# Patient Record
Sex: Male | Born: 1990 | Race: White | Hispanic: No | Marital: Single | Smoking: Current every day smoker
Health system: Southern US, Community
[De-identification: ages and names within clinical notes are randomized; demographics above are authoritative.]

---
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
## 2008-04-18 ENCOUNTER — Emergency Department: Payer: Self-pay | Admitting: Emergency Medicine

## 2008-08-16 IMAGING — CR DG CHEST 2V
1 series · 2 of 2 positions shown · non-contrast
Comparison: none

REASON FOR EXAM: Fever, productive cough
COMMENTS:   LMP: (Male)

PROCEDURE:     DXR - DXR CHEST PA (OR AP) AND LATERAL  - June 14, 2007  [DATE]
RESULT:     The lung fields are clear. No pneumonia, pneumothorax or pleural
effusion is seen. The heart size is normal. The chest is mildly
hyperexpanded suspicious for reactive airway disease.

[Series 1: view not recorded · 0.17mm/px · 2 of 2 slices shown]
[im 1/2]
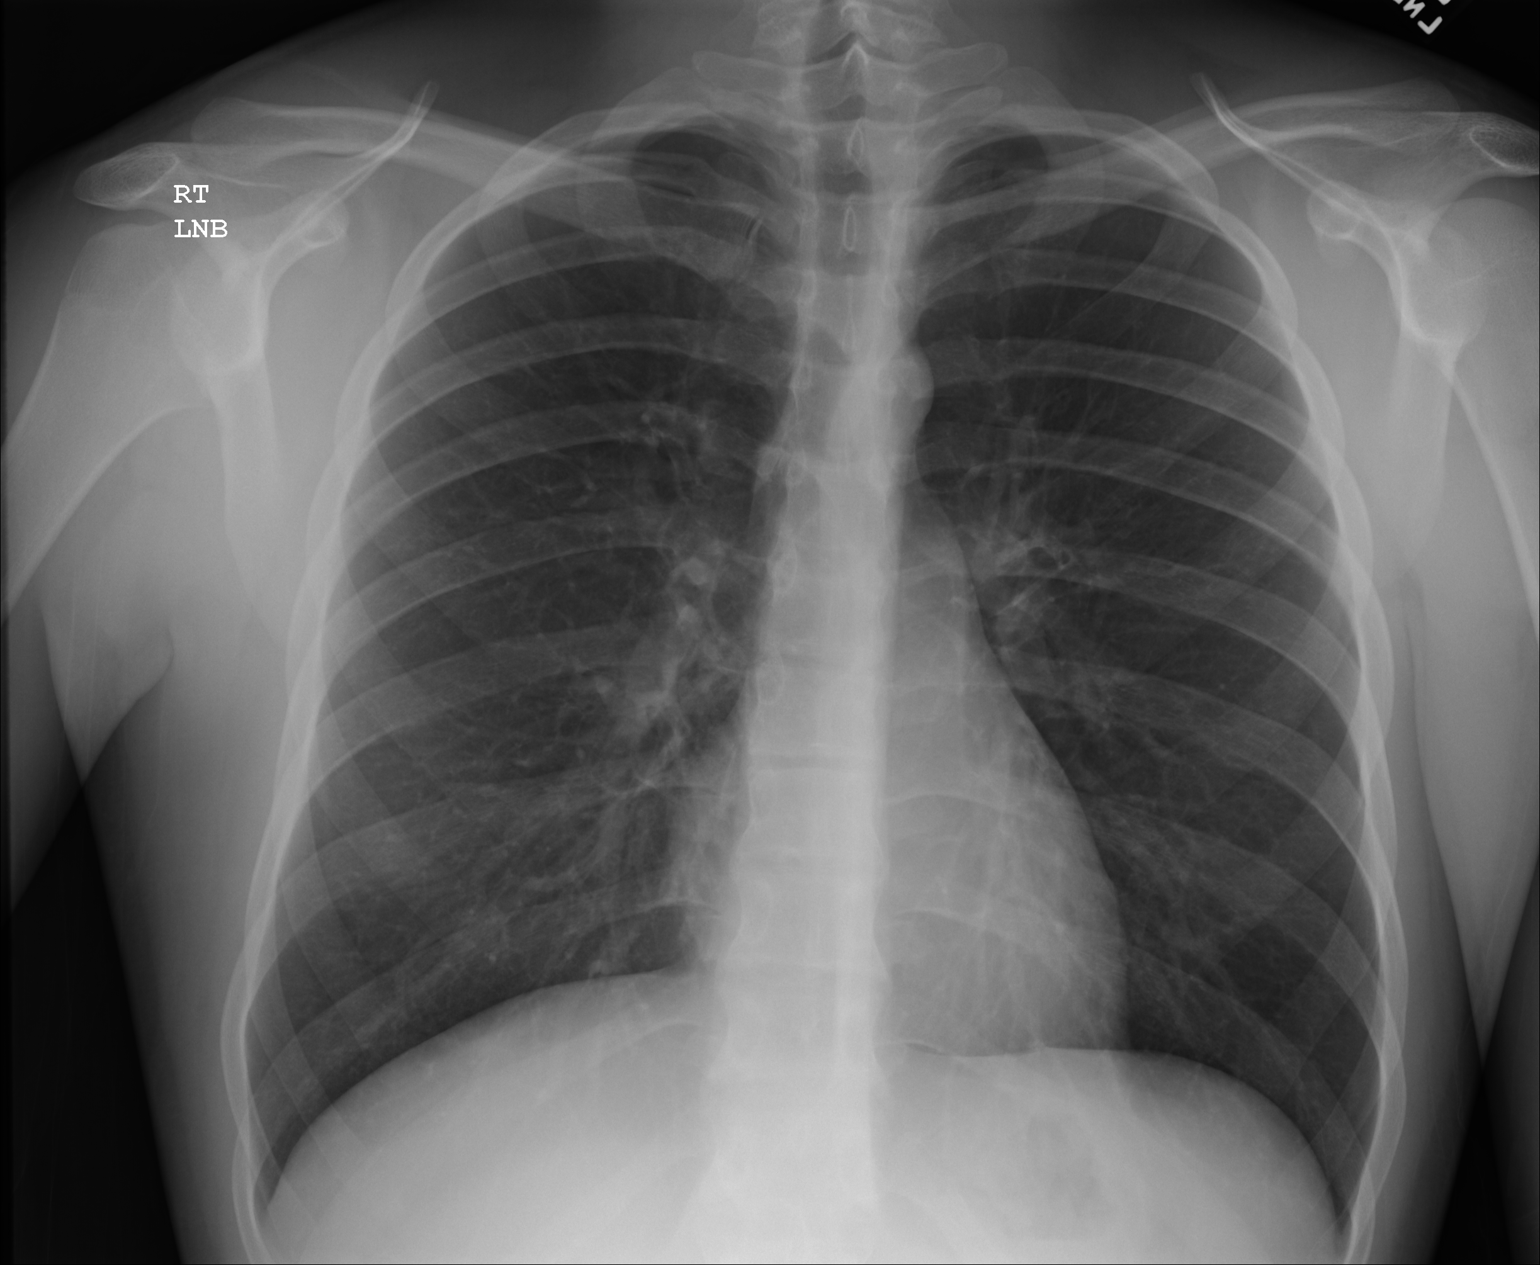
[im 2/2]
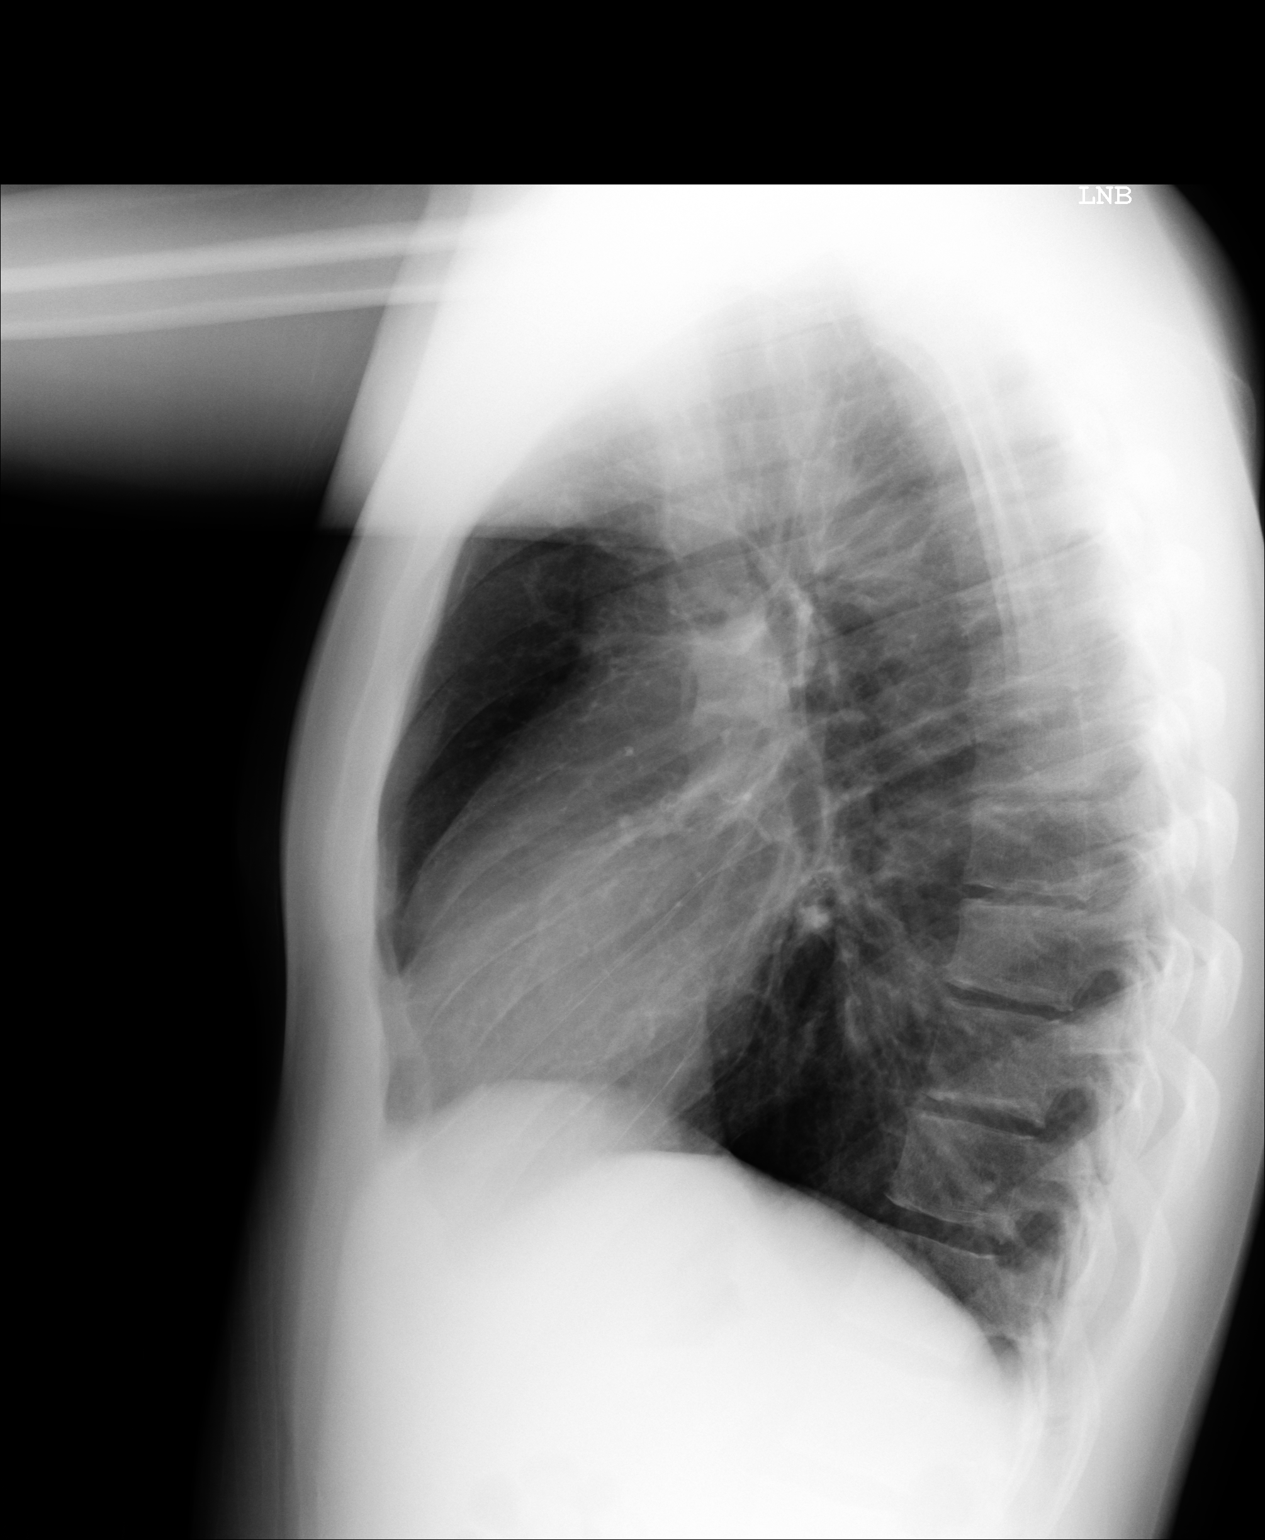

[2 of 2 positions shown; findings below may reference images not displayed]

IMPRESSION: 1.  The lung fields are clear.
2.  The chest is mildly hyperexpanded.

## 2009-06-21 IMAGING — CR RIGHT HAND - COMPLETE 3+ VIEW
1 series · 3 of 3 positions shown · non-contrast
Comparison: none

REASON FOR EXAM: pain after hitting wall
COMMENTS:

PROCEDURE:     DXR - DXR HAND RT COMPLETE W/OBLIQUES  - April 18, 2008  [DATE]
RESULT:     No fracture, dislocation or other acute bony abnormality is
identified.

[Series 1: view not recorded · 0.17mm/px · 3 of 3 slices shown]
[im 1/3]
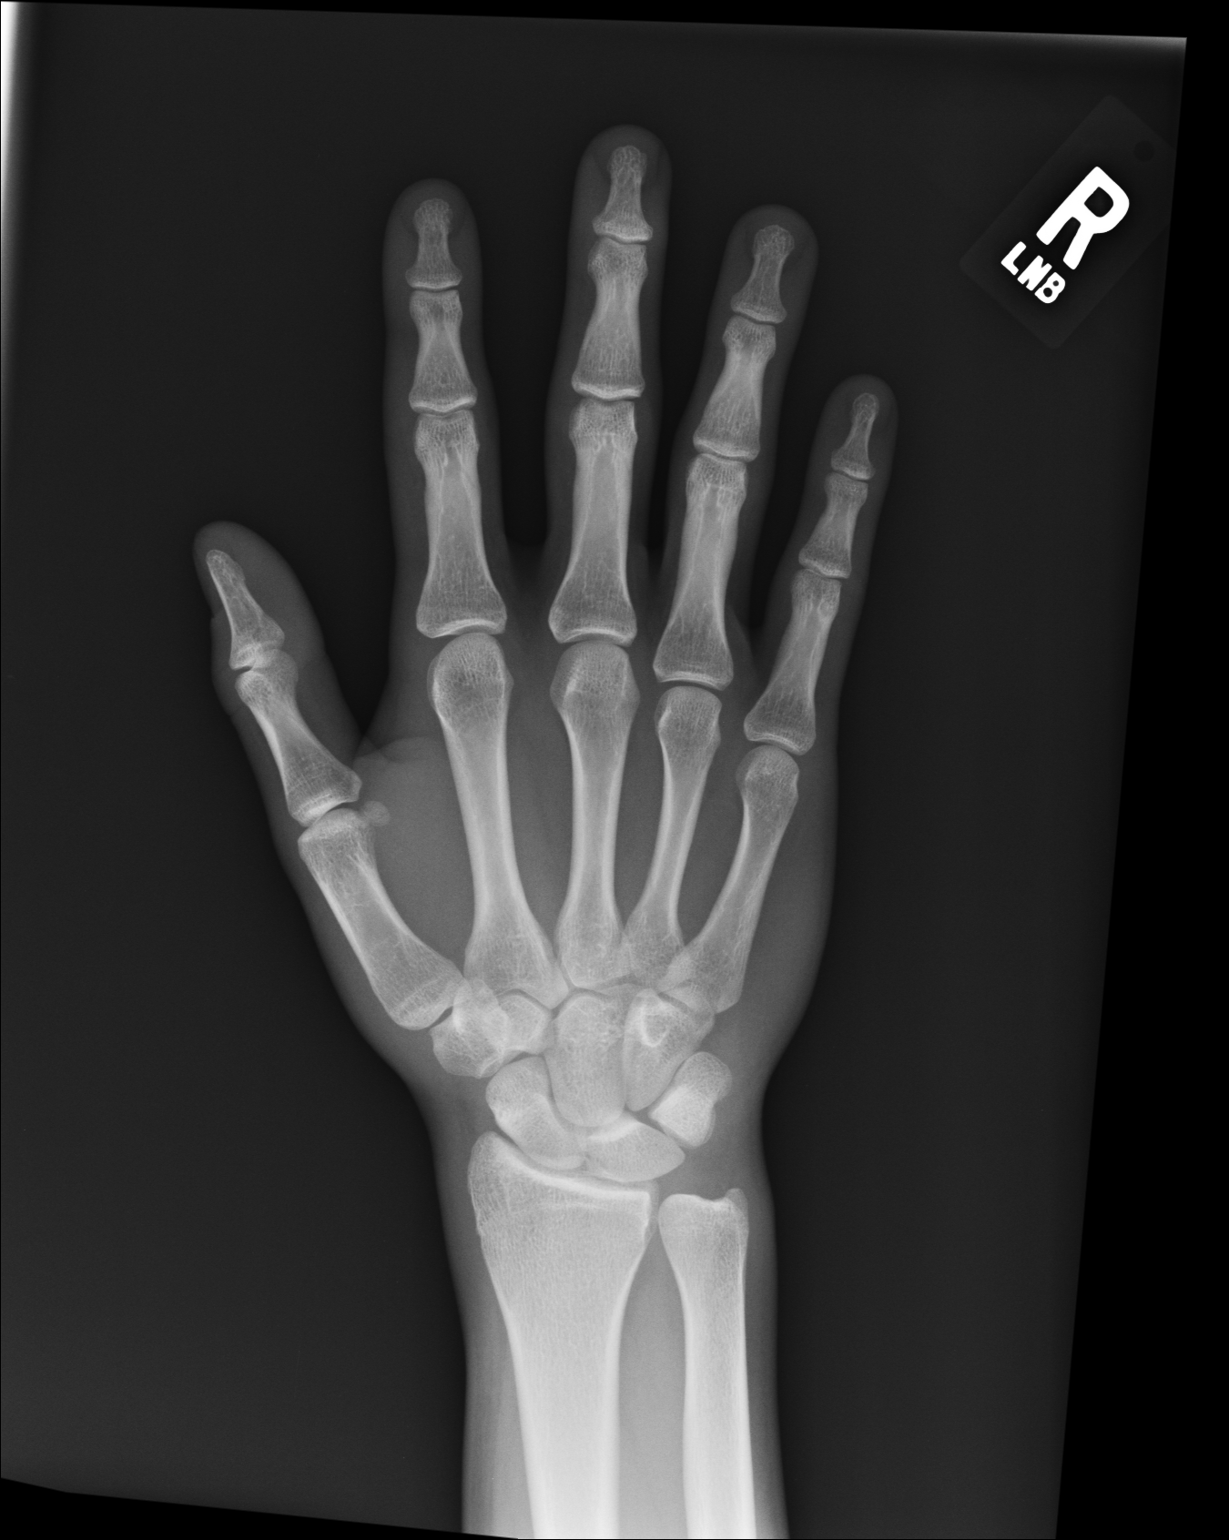
[im 2/3]
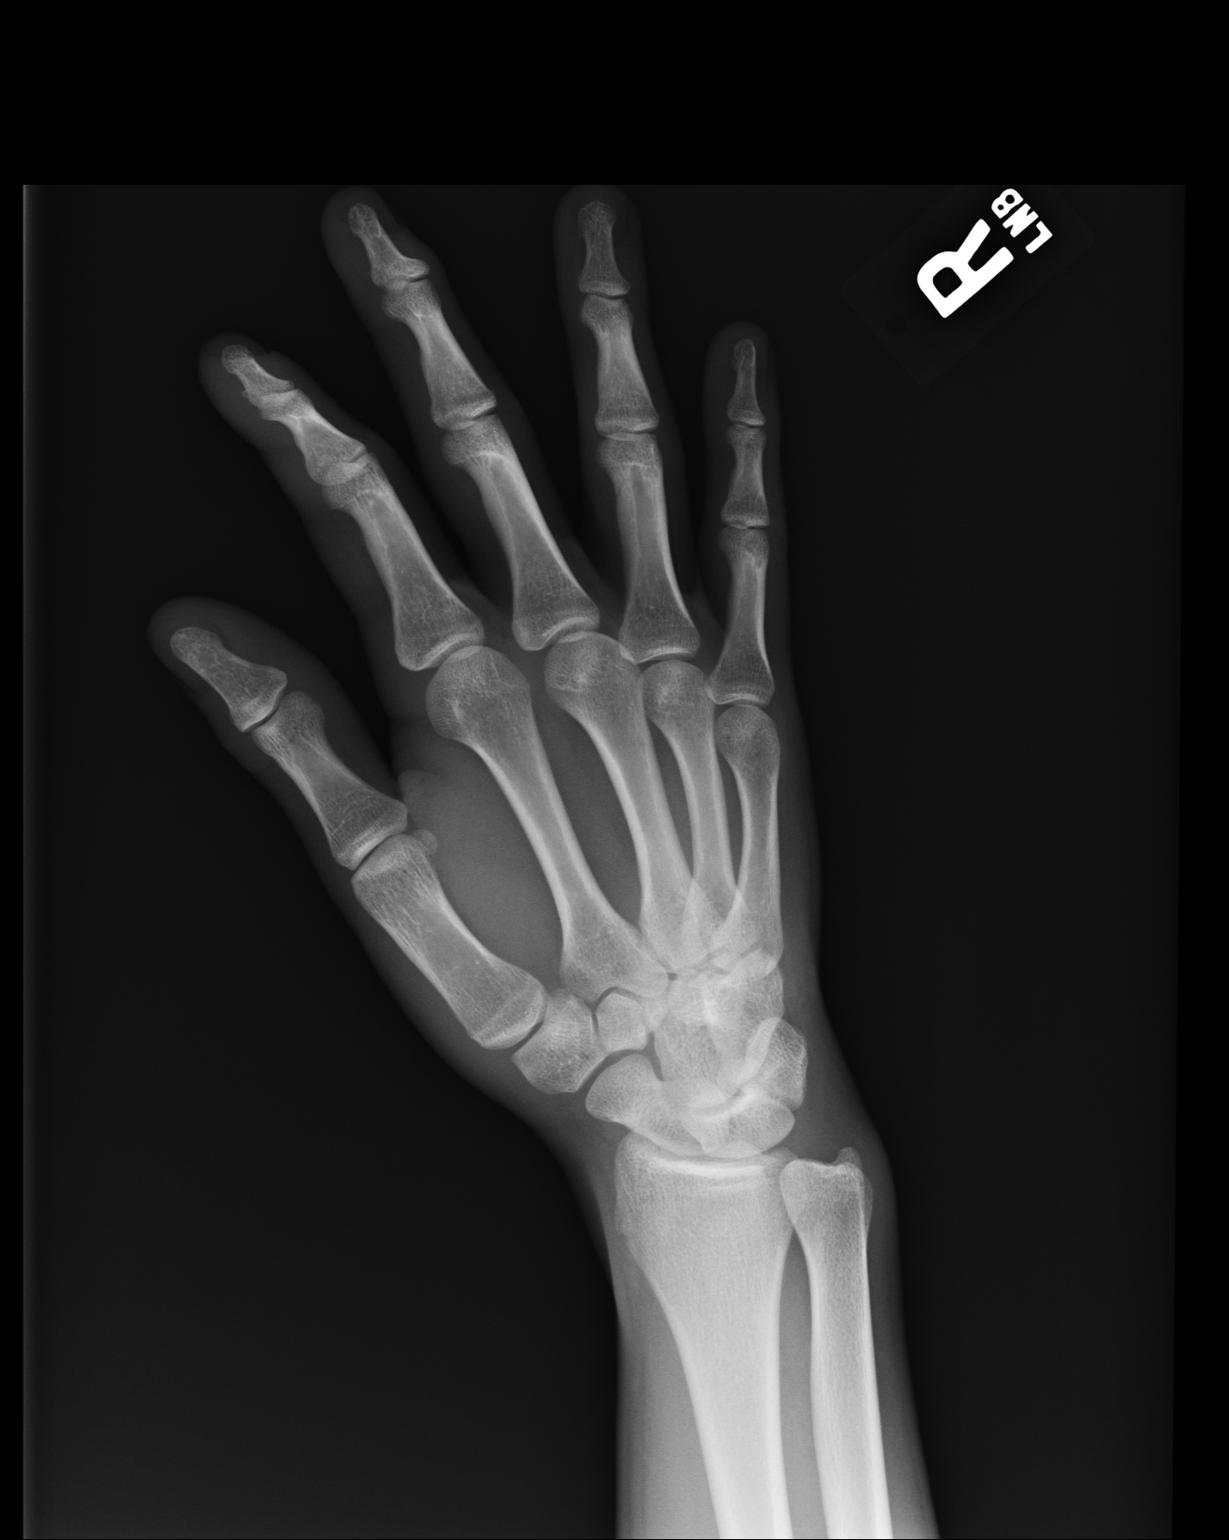
[im 3/3]
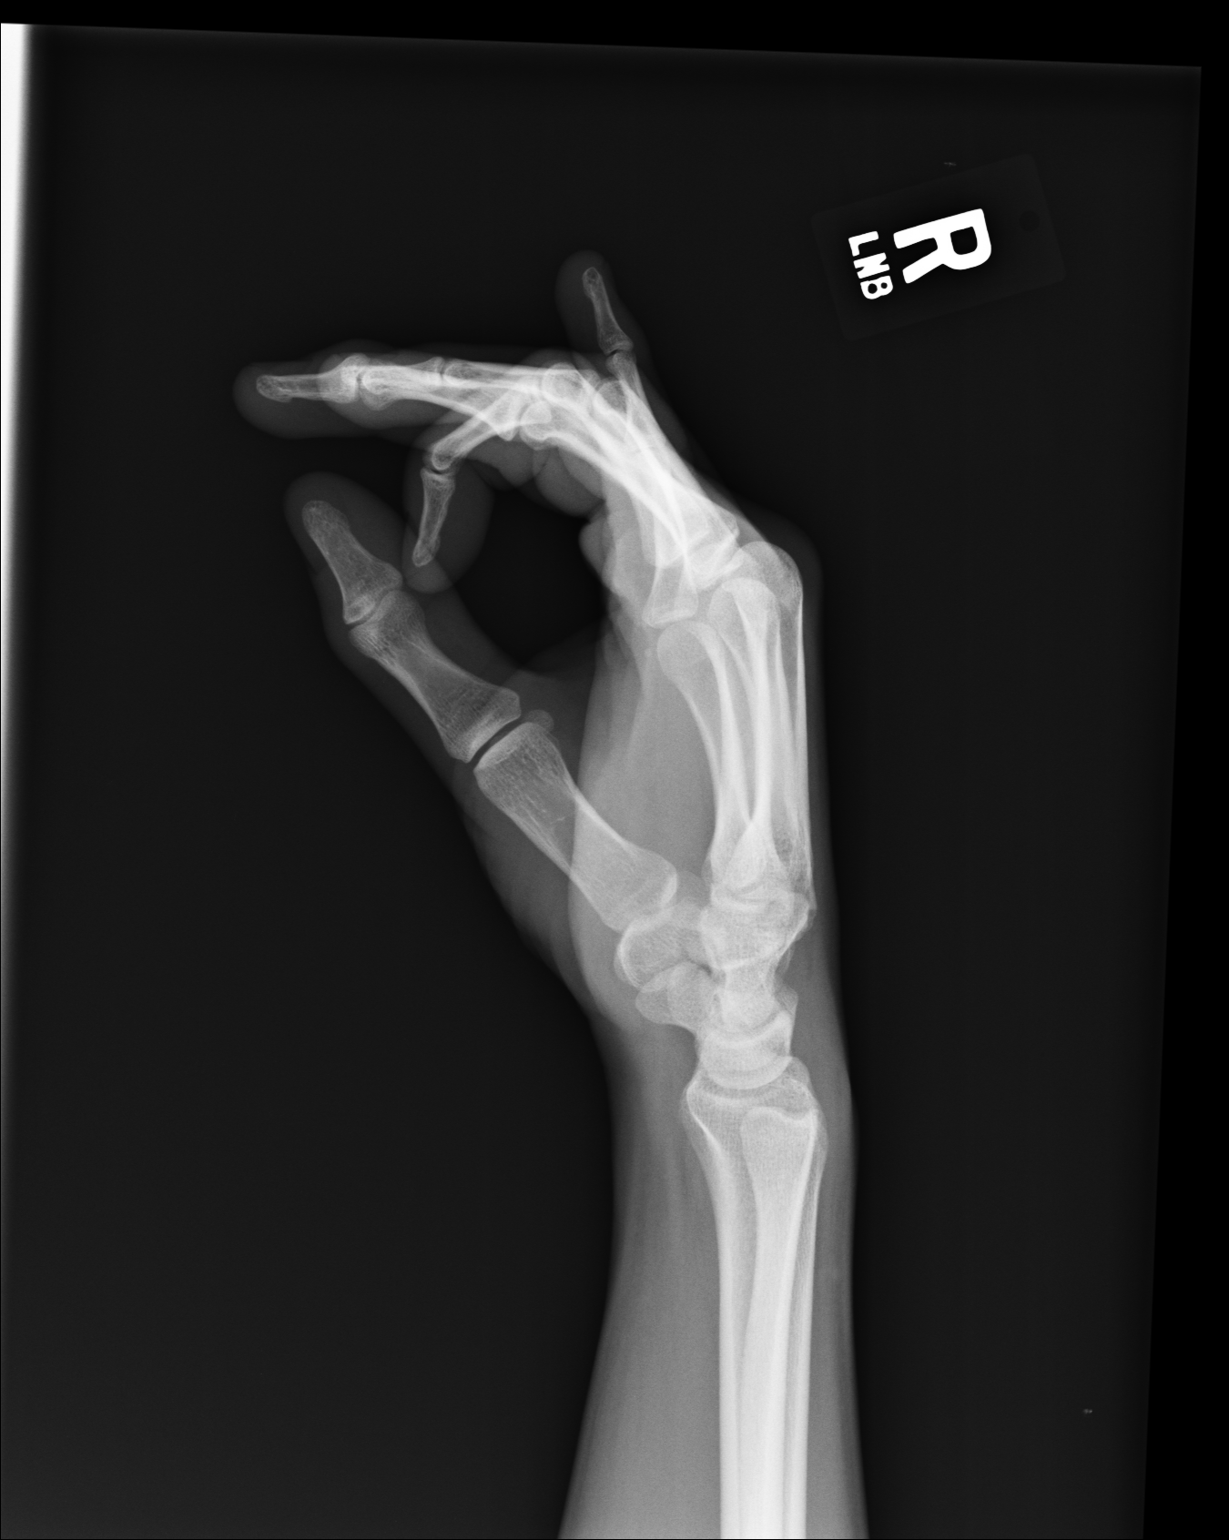

[3 of 3 positions shown; findings below may reference images not displayed]

IMPRESSION: 1.     No significant osseous abnormalities are noted.

## 2010-02-19 ENCOUNTER — Emergency Department: Payer: Self-pay | Admitting: Emergency Medicine

## 2010-03-01 ENCOUNTER — Ambulatory Visit: Payer: Self-pay | Admitting: Otolaryngology

## 2011-01-30 ENCOUNTER — Emergency Department: Payer: Self-pay | Admitting: Internal Medicine

## 2012-04-03 IMAGING — CT CT HEAD WITHOUT CONTRAST
2 series · 16 of 30 positions shown, 20 images · non-contrast
Comparison: none

REASON FOR EXAM: pain
COMMENTS:

PROCEDURE:     CT  - CT HEAD WITHOUT CONTRAST  - January 30, 2011  [DATE]
RESULT:     Comparison:  None
TECHNIQUE: Multiple axial images from the foramen magnum to the vertex were
obtained without IV contrast.

[Series 2: without · axial · non-contrast · 0.40mm/px · z∈[+960,+1080]mm · 13 of 30 slices shown, 17 images]
[im 3/30  brain]
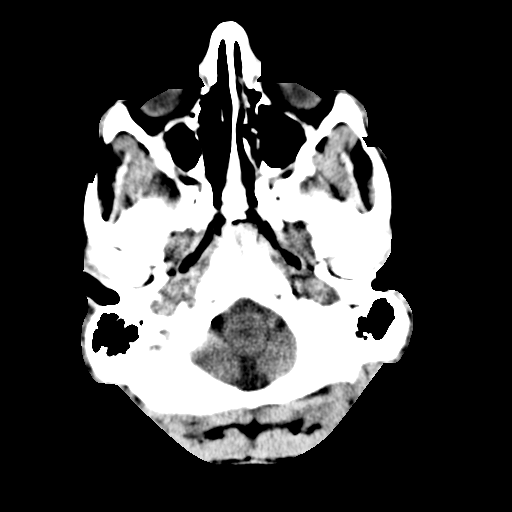
[im 3/30  bone]
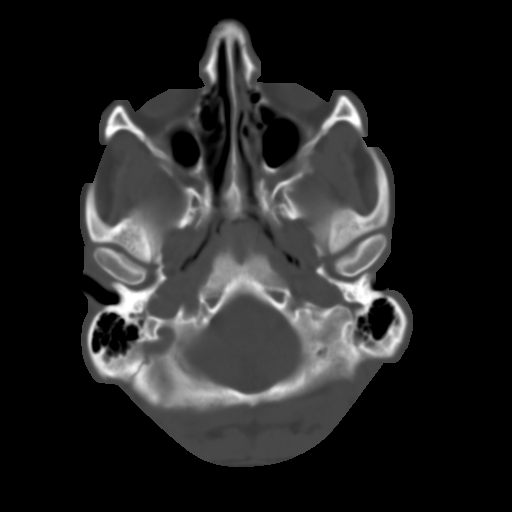
[im 5/30  brain]
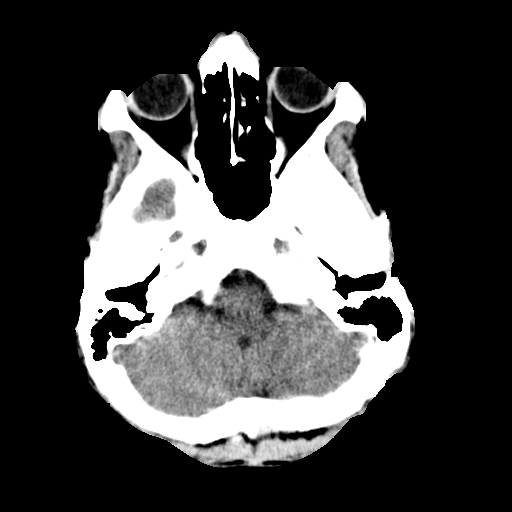
[im 7/30  brain]
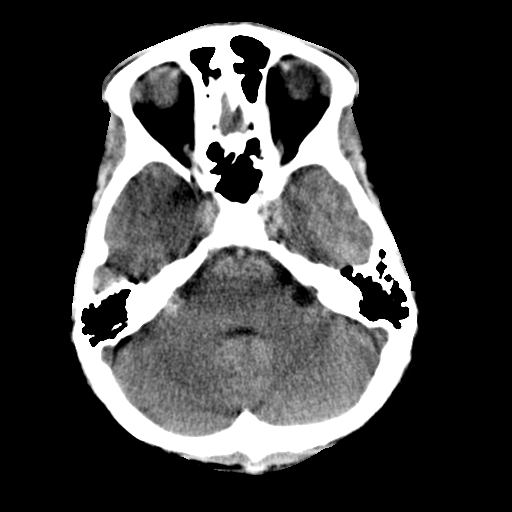
[im 9/30  brain]
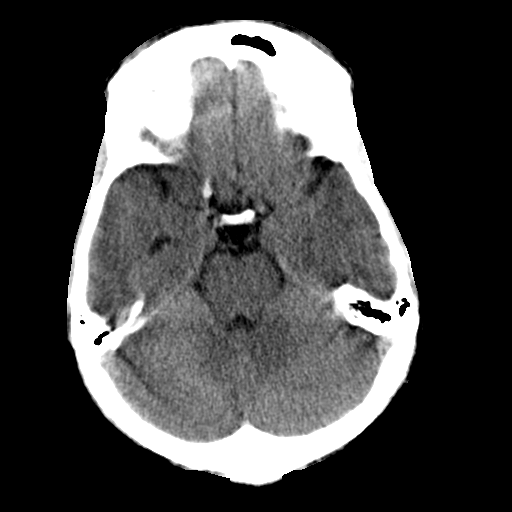
[im 11/30  brain]
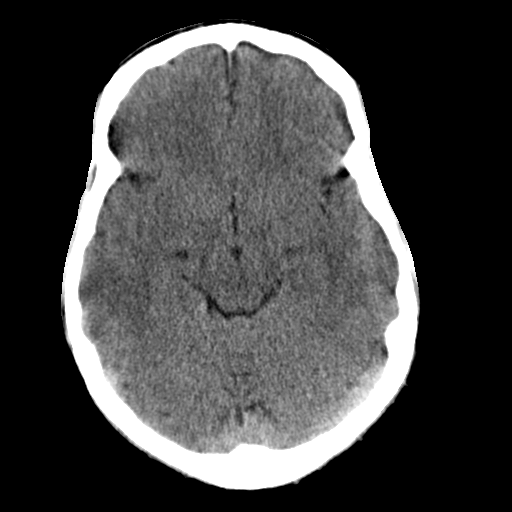
[im 11/30  bone]
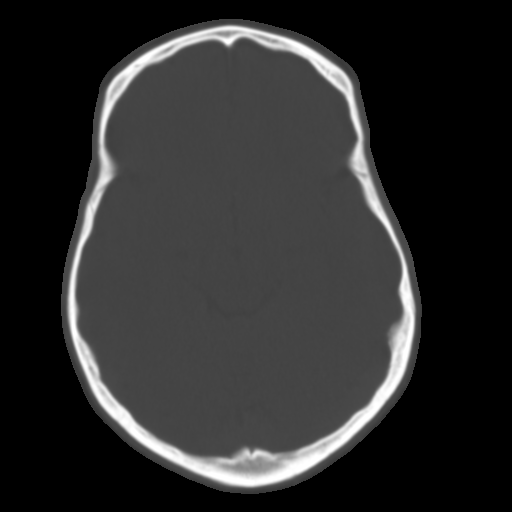
[im 13/30  brain]
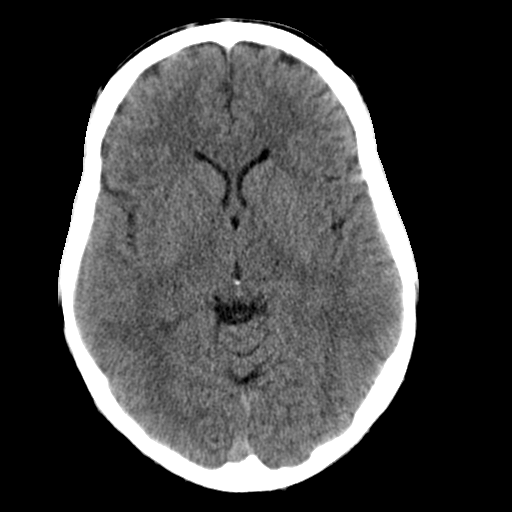
[im 15/30  brain]
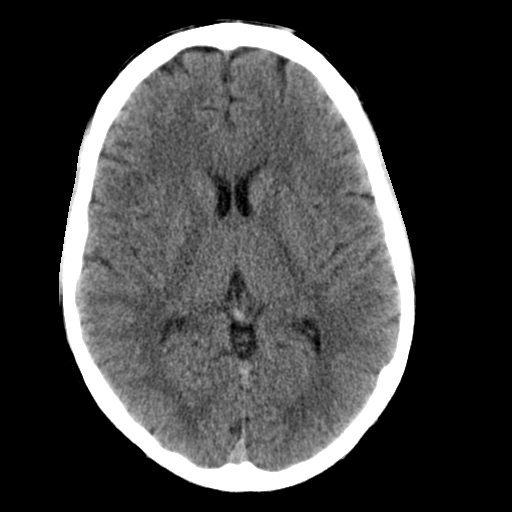
[im 17/30  brain]
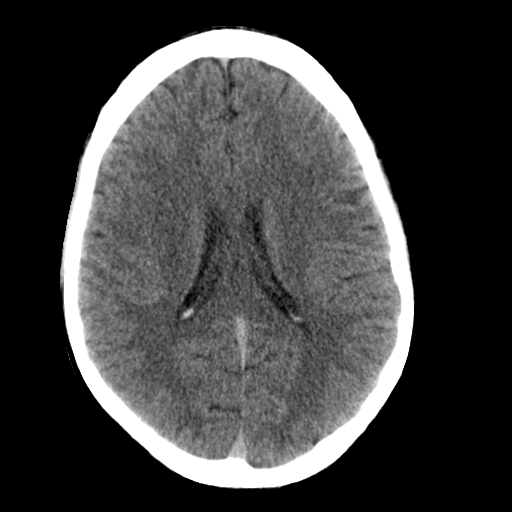
[im 19/30  brain]
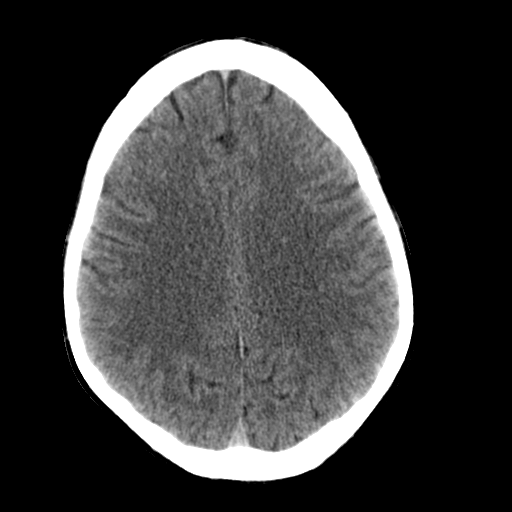
[im 19/30  bone]
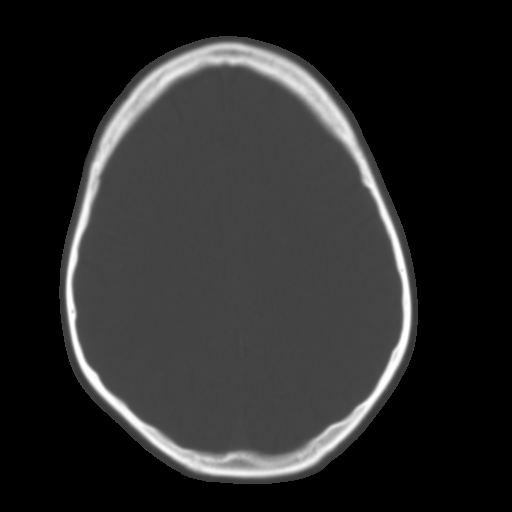
[im 21/30  brain]
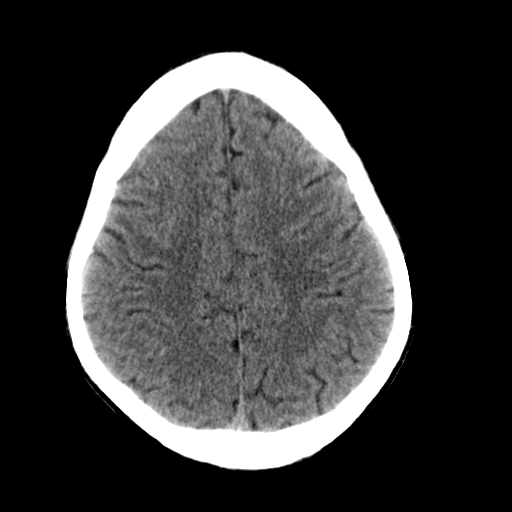
[im 23/30  brain]
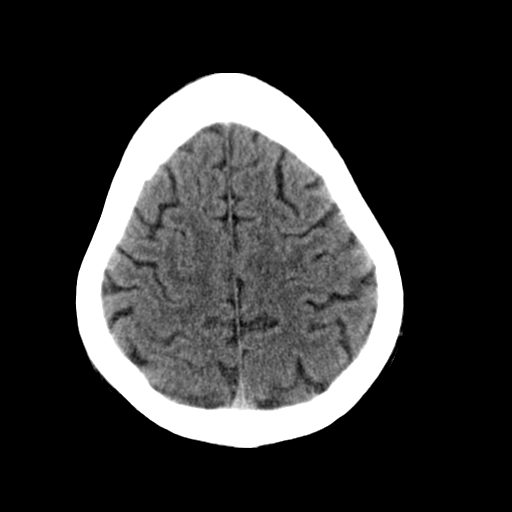
[im 25/30  brain]
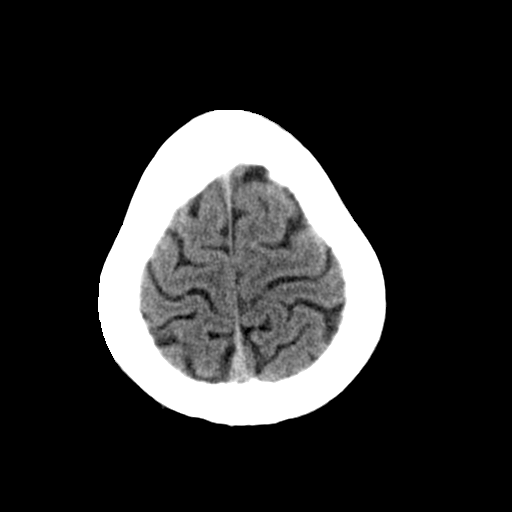
[im 27/30  brain]
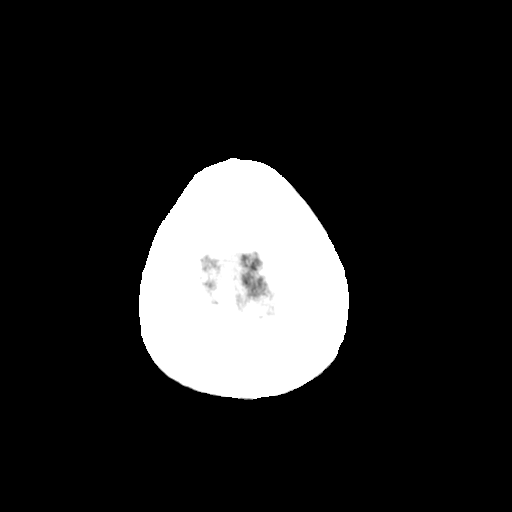
[im 27/30  bone]
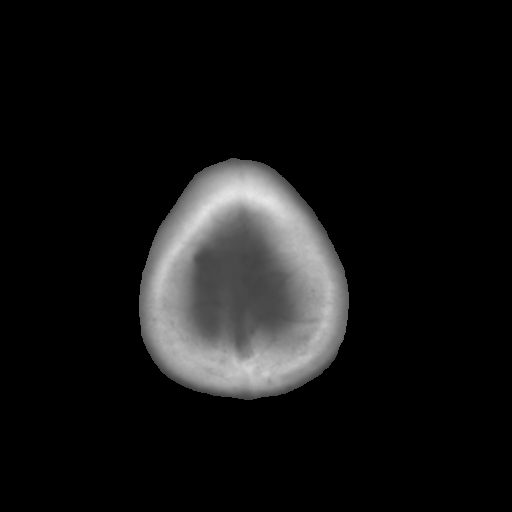

[Series 3: bone · axial · 0.40mm/px · z∈[+960,+1000]mm · 3 of 30 slices shown]
[im 3/30  bone]
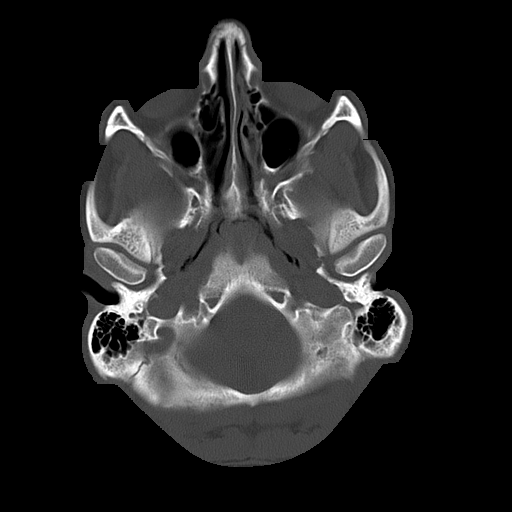
[im 7/30  bone]
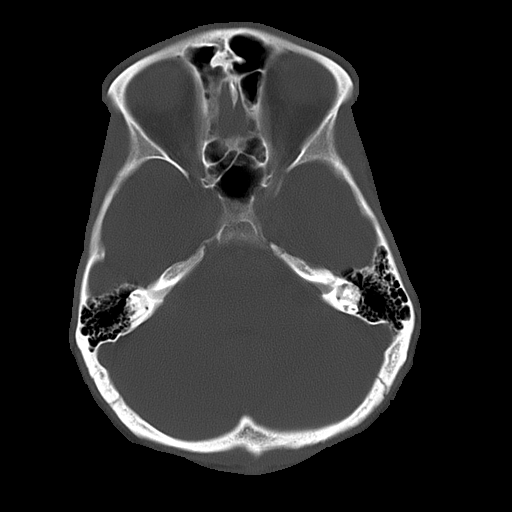
[im 11/30  bone]
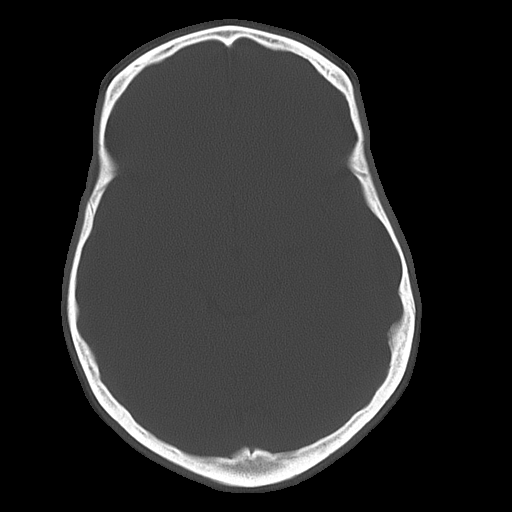

[16 of 30 positions shown; findings below may reference images not displayed]

FINDINGS: There is no evidence of mass effect, midline shift, or extra-axial fluid
collections.  There is no evidence of a space-occupying lesion or
intracranial hemorrhage. There is no evidence of a cortical-based area of
acute infarction.

The ventricles and sulci are appropriate for the patient's age. The basal
cisterns are patent.

Visualized portions of the orbits are unremarkable. The visualized portions
of the paranasal sinuses and mastoid air cells are unremarkable.

The osseous structures are unremarkable.
IMPRESSION: No acute intracranial process.

## 2017-03-21 ENCOUNTER — Encounter: Payer: Self-pay | Admitting: Emergency Medicine

## 2017-03-21 ENCOUNTER — Emergency Department
Admission: EM | Admit: 2017-03-21 | Discharge: 2017-03-21 | Disposition: A | Discharge status: Home / Self Care | Payer: Self-pay | Attending: Emergency Medicine | Admitting: Emergency Medicine

## 2017-03-21 DIAGNOSIS — F17200 Nicotine dependence, unspecified, uncomplicated: Secondary | ICD-10-CM | POA: Insufficient documentation

## 2017-03-21 DIAGNOSIS — R0981 Nasal congestion: Secondary | ICD-10-CM | POA: Insufficient documentation

## 2017-03-21 DIAGNOSIS — J020 Streptococcal pharyngitis: Secondary | ICD-10-CM | POA: Insufficient documentation

## 2017-03-21 DIAGNOSIS — J3489 Other specified disorders of nose and nasal sinuses: Secondary | ICD-10-CM | POA: Insufficient documentation

## 2017-03-21 LAB — POCT RAPID STREP A: Streptococcus, Group A Screen (Direct): NEGATIVE

## 2017-03-21 MED ORDER — MAGIC MOUTHWASH W/LIDOCAINE: 5.0000 mL | Freq: Four times a day (QID) | ORAL | 0 refills | Status: AC

## 2017-03-21 MED ORDER — IBUPROFEN 600 MG PO TABS: 600.0000 mg | ORAL_TABLET | Freq: Three times a day (TID) | ORAL | 0 refills | Status: AC | PRN

## 2017-03-21 MED ORDER — FEXOFENADINE-PSEUDOEPHED ER 60-120 MG PO TB12: 1.0000 | ORAL_TABLET | Freq: Two times a day (BID) | ORAL | 0 refills | Status: AC

## 2017-03-21 NOTE — ED Triage Notes (Signed)
Patient ambulatory to triage with steady gait, without difficulty or distress noted; pt reports sore throat x 2 days with chills

## 2017-03-21 NOTE — ED Provider Notes (Signed)
[facility] Emergency Department Provider Note   ____________________________________________   First MD Initiated Contact with Patient 03/21/17 0703     (approximate)  I have reviewed the triage vital signs and the nursing notes.   HISTORY  Chief Complaint Sore Throat    HPI Roberto Stout is a 26 y.o. male patient complaining of sore throat, nasal congestion, and chills. Onset 2 days. Patient denies nausea, vomiting, diarrhea. Patient also complaining of bilateral ear pressure.Patient rates pain discomfort as 7/10. Patient describes pain as "sore throat". No palliative measures for complaint.   History reviewed. No pertinent past medical history.  There are no active problems to display for this patient.   History reviewed. No pertinent surgical history.  Prior to Admission medications   Medication Sig Start Date End Date Taking? Authorizing Provider  fexofenadine-pseudoephedrine (ALLEGRA-D) 60-120 MG 12 hr tablet Take 1 tablet by mouth 2 (two) times daily. 03/21/17   Joni Reining, PA-C  ibuprofen (ADVIL,MOTRIN) 600 MG tablet Take 1 tablet (600 mg total) by mouth every 8 (eight) hours as needed. 03/21/17   Joni Reining, PA-C  magic mouthwash w/lidocaine SOLN Take 5 mLs by mouth 4 (four) times daily. 03/21/17   Joni Reining, PA-C    Allergies Patient has no known allergies.  No family history on file.  Social History Social History  Substance Use Topics  . Smoking status: Current Every Day Smoker  . Smokeless tobacco: Never Used  . Alcohol use No    Review of Systems  Constitutional: Fever and chills  Eyes: No visual changes. ENT: Sore throat, nasal congestion, ear pressure. Cardiovascular: Denies chest pain. Respiratory: Denies shortness of breath. Gastrointestinal: No abdominal pain.  No nausea, no vomiting.  No diarrhea.  No constipation. Genitourinary: Negative for dysuria. Musculoskeletal: Negative for back  pain. Skin: Negative for rash. Neurological: Negative for headaches, focal weakness or numbness.   ____________________________________________   PHYSICAL EXAM:  VITAL SIGNS: ED Triage Vitals [03/21/17 0649]  Enc Vitals Group     BP 138/67     Pulse Rate 69     Resp 18     Temp 98.1 F (36.7 C)     Temp Source Oral     SpO2 97 %     Weight 175 lb (79.4 kg)     Height 6' (1.829 m)     Head Circumference      Peak Flow      Pain Score 7     Pain Loc      Pain Edu?      Excl. in GC?     Constitutional: Alert and oriented. Well appearing and in no acute distress. Eyes: Conjunctivae are normal. PERRL. EOMI. Head: Atraumatic. Nose: No congestion/rhinnorhea. Mouth/Throat: Mucous membranes are moist.  Oropharynx non-erythematous. Neck: No stridor.  No cervical spine tenderness to palpation. Hematological/Lymphatic/Immunilogical: No cervical lymphadenopathy. Cardiovascular: Normal rate, regular rhythm. Grossly normal heart sounds.  Good peripheral circulation. Respiratory: Normal respiratory effort.  No retractions. Lungs CTAB. Neurologic:  Normal speech and language. No gross focal neurologic deficits are appreciated. No gait instability. Skin:  Skin is warm, dry and intact. No rash noted. Psychiatric: Mood and affect are normal. Speech and behavior are normal.  ____________________________________________   LABS (all labs ordered are listed, but only abnormal results are displayed)  Labs Reviewed  CULTURE, GROUP A STREP [facility])  POCT RAPID STREP A   ____________________________________________  EKG   ____________________________________________  RADIOLOGY   ____________________________________________  PROCEDURES  Procedure(s) performed: None  Procedures  Critical Care performed: No  ____________________________________________   INITIAL IMPRESSION / ASSESSMENT AND PLAN / ED COURSE  Pertinent labs & imaging results that were available during my  care of the patient were reviewed by me and considered in my medical decision making (see chart for details).  Pharyngitis. Discussed negative rapid strep test Norvasc culture is pending. Patient given discharge care instructions. [facility].      ____________________________________________   FINAL CLINICAL IMPRESSION(S) / ED DIAGNOSES  Final diagnoses:  Strep throat  Nasal congestion with rhinorrhea      NEW MEDICATIONS STARTED DURING THIS VISIT:  New Prescriptions   FEXOFENADINE-PSEUDOEPHEDRINE (ALLEGRA-D) 60-120 MG 12 HR TABLET    Take 1 tablet by mouth 2 (two) times daily.   IBUPROFEN (ADVIL,MOTRIN) 600 MG TABLET    Take 1 tablet (600 mg total) by mouth every 8 (eight) hours as needed.   MAGIC MOUTHWASH W/LIDOCAINE SOLN    Take 5 mLs by mouth 4 (four) times daily.     Note:  This document was prepared using Dragon voice recognition software and may include unintentional dictation errors.    Smith, Ronald K, PA-C 03/21/17 Beaulah DinJoni [id number]    Governor RooksLord, Rebecca, MD 03/21/17 [phone]

## 2017-03-23 LAB — CULTURE, GROUP A STREP (THRC)

## 2017-03-24 NOTE — Progress Notes (Signed)
ED Antimicrobial Stewardship Positive Culture Follow Up   Roberto Stout is an 26 y.o. male who [facility] with a chief complaint of  Chief Complaint  Patient presents with  . Sore Throat    Recent Results (from the past 720 hour(s))  Culture, group A strep     Status: None   Collection Time: 03/21/17  6:45 AM  Result Value Ref Range Status   Specimen Description THROAT  Final   Special Requests NONE  Final   Culture MODERATE GROUP A STREP (S.PYOGENES) ISOLATED  Final   Report Status 03/23/2017 FINAL  Final    [x]  Patient discharged originally without antimicrobial agent and treatment is now indicated  New antibiotic prescription: amoxicillin 500 mg PO BID x 10 days  ED Provider: Dr. Harvie BridgeKinner  Spoke with patient and explained results. Patient reports feeling slight improvement. Called prescription into [facility] on [address] [place], [place]. Patient denied any medication allergies.  Cindi CarbonMary M Oziah Vitanza, PharmD 03/24/17 2:04 PM
